# Patient Record
Sex: Male | Born: 1970 | Race: White | Hispanic: No | Marital: Single | Smoking: Current every day smoker
Health system: Southern US, Community
[De-identification: ages and names within clinical notes are randomized; demographics above are authoritative.]

---

## 2009-12-19 ENCOUNTER — Emergency Department (HOSPITAL_COMMUNITY)
Admission: EM | Admit: 2009-12-19 | Discharge: 2009-12-19 | Payer: Self-pay | Source: Home / Self Care | Admitting: Emergency Medicine

## 2015-03-17 ENCOUNTER — Telehealth: Payer: Self-pay

## 2015-03-17 ENCOUNTER — Ambulatory Visit (INDEPENDENT_AMBULATORY_CARE_PROVIDER_SITE_OTHER): Payer: Self-pay | Admitting: Family Medicine

## 2015-03-17 VITALS — BP 112/78 | HR 72 | Temp 98.2°F | Resp 16 | Ht 70.0 in | Wt 212.0 lb

## 2015-03-17 DIAGNOSIS — G44219 Episodic tension-type headache, not intractable: Secondary | ICD-10-CM

## 2015-03-17 DIAGNOSIS — J069 Acute upper respiratory infection, unspecified: Secondary | ICD-10-CM

## 2015-03-17 MED ORDER — PREDNISONE 20 MG PO TABS: ORAL_TABLET | ORAL | Status: DC

## 2015-03-17 MED ORDER — PREDNISONE 20 MG PO TABS
ORAL_TABLET | ORAL | Status: DC
Start: 2015-03-17 — End: 2015-04-23

## 2015-03-17 MED ORDER — CARISOPRODOL 350 MG PO TABS: 350.0000 mg | ORAL_TABLET | Freq: Two times a day (BID) | ORAL | Status: DC | PRN

## 2015-03-17 NOTE — Telephone Encounter (Signed)
Had to call [facility] [address] to cancel Prednisone Rx because it was sent to the wrong pharmacy. Sent new Rx to new pharmacy.

## 2015-03-17 NOTE — Progress Notes (Addendum)
 @[facility]FCLOGO @  This chart was scribed for Elvina SidleKurt Lauenstein, MD by Andrew Auaven Small, ED Scribe. This patient was seen in room 10 and the patient's care was started at 9:47 AM.  Patient ID: Dylan Ellis MRN: [id number], DOB: 03/17/1971, 44 y.o. Date of Encounter: 03/17/2015, 9:50 AM  Primary Physician: No primary care provider on file.  Chief Complaint:  Chief Complaint  Patient presents with   Headache   Nasal Congestion    x 1 week    Fatigue     HPI: [age] y.o. year old male with history below presents with a right sided tension HA for about 1.5 weeks. Pt states for the past week and a half he has been waking up with a right sided tension HA.  He reports associated congestion, rhinorrhea, chills, fatigue and feeling run down. His sister gave her a left over Zpak without relief to symptoms, he has had similar pain in the past and was prescribed a muscle relaxer and prednisone which she states worked well for her. 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 Pt states he really enjoyed her original job and has been recently stressed about work, possibly causing symptoms.   History reviewed. No pertinent past medical history.   Home Meds: Prior to Admission medications   Not on File    Allergies: No Known Allergies  Social History   Social History   Marital Status: Single    Spouse Name: N/A   Number of Children: N/A   Years of Education: N/A   Occupational History   Not on file.   Social History Main Topics   Smoking status: Current Every Day Smoker   Smokeless tobacco: Not on file   Alcohol Use: No   Drug Use: No   Sexual Activity: Not on file   Other Topics Concern   Not on file   Social History Narrative   No narrative on file     Review of Systems: Constitutional: negative for chills, fever, night sweats, weight  changes. HEENT: negative for vision changes, hearing loss,  ST, epistaxis, or sinus pressure Cardiovascular: negative for chest pain or palpitations Respiratory: negative for hemoptysis, wheezing, shortness of breath, or cough Abdominal: negative for abdominal pain, nausea, vomiting, diarrhea, or constipation Dermatological: negative for rash Neurologic: negative for dizziness, or syncope All other systems reviewed and are otherwise negative with the exception to those above and in the HPI.   Physical Exam: Blood pressure 112/78, pulse 72, temperature 98.2 F (36.8 C), temperature source Oral, resp. rate 16, height 5\' 10"  (1.778 m), weight 212 lb (96.163 kg), SpO2 98 %., Body mass index is 30.42 kg/(m^2). General: Well developed, well nourished, in no acute distress. Head: Normocephalic, atraumatic, eyes without discharge, sclera non-icteric, nares are without discharge. Bilateral auditory canals clear, TM's are without perforation, pearly grey and translucent with reflective cone of light bilaterally. Oral cavity moist, posterior pharynx without exudate, erythema, peritonsillar abscess, or post nasal drip.  Neck: Supple. No thyromegaly. Full ROM. No lymphadenopathy. Lungs: Clear bilaterally to auscultation without wheezes, rales, or rhonchi. Breathing is unlabored. Heart: RRR with S1 S2. No murmurs, rubs, or gallops appreciated. Abdomen: Soft, non-tender, non-distended with normoactive bowel sounds. No hepatomegaly. No rebound/guarding. No obvious abdominal masses. Msk:  Strength and tone normal for age. Extremities/Skin: Warm and dry. No clubbing or cyanosis. No  edema. No rashes or suspicious lesions. Neuro: Alert and oriented X 3. Moves all extremities spontaneously. Gait is normal. CNII-XII grossly in tact. Psych:  Responds to questions appropriately with a normal affect.     ASSESSMENT AND PLAN:  44 y.o. year old male with  This chart was scribed in my presence and reviewed by me  personally.    ICD-9-CM ICD-10-CM   1. Episodic tension-type headache, not intractable 339.11 G44.219 carisoprodol (SOMA) 350 MG tablet  2. Acute upper respiratory infection 465.9 J06.9 predniSONE (DELTASONE) 20 MG tablet     DISCONTINUED: predniSONE (DELTASONE) 20 MG tablet     By signing my name below, I, Raven Small, attest that this documentation has been prepared under the direction and in the presence of Elvina Sidle, MD.  Electronically Signed: Andrew Au, ED Scribe. 03/17/2015. 9:58 AM.  Signed, Elvina Sidle, MD 03/17/2015 9:50 AM

## 2015-04-23 ENCOUNTER — Ambulatory Visit (INDEPENDENT_AMBULATORY_CARE_PROVIDER_SITE_OTHER): Payer: Self-pay | Admitting: Emergency Medicine

## 2015-04-23 VITALS — BP 118/72 | HR 113 | Temp 98.7°F | Resp 18 | Ht 70.0 in | Wt 213.0 lb

## 2015-04-23 DIAGNOSIS — J069 Acute upper respiratory infection, unspecified: Secondary | ICD-10-CM

## 2015-04-23 DIAGNOSIS — J0100 Acute maxillary sinusitis, unspecified: Secondary | ICD-10-CM

## 2015-04-23 LAB — GLUCOSE, POCT (MANUAL RESULT ENTRY): POC Glucose: 102 mg/dl — AB (ref 70–99)

## 2015-04-23 MED ORDER — AMOXICILLIN-POT CLAVULANATE 875-125 MG PO TABS: 1.0000 | ORAL_TABLET | Freq: Two times a day (BID) | ORAL | Status: DC

## 2015-04-23 MED ORDER — PREDNISONE 20 MG PO TABS: ORAL_TABLET | ORAL | Status: DC

## 2015-04-23 MED ORDER — FLUTICASONE PROPIONATE 50 MCG/ACT NA SUSP: 2.0000 | Freq: Every day | NASAL | Status: DC

## 2015-04-23 NOTE — Progress Notes (Signed)
This chart was scribed for Dylan ChrisSteven Karlissa Aron, MD by Andrew Auaven Small, ED Scribe. This patient was seen in room 3 and the patient's care was started at 1:02 PM.  Chief Complaint:  Chief Complaint  Patient presents with  . Sinusitis    x3 days  . Headache    rt side of headache  . Facial Pain    rt side     HPI: Dylan Ellis is a 44 y.o. male who [facility]. Pt was seen here about a month ago for a URI and was prescribed prednisone. Pt states symptoms improved while taking medication but once he stop he began to develop sinus symptoms consisting of sinus pressure, sinus tenderness, chills, right sided HA and a mild cough. Pt took left over amoxicillin. He denies fever. No drug allergies. Pt smokes a gram of marijuana a day.   Pt also complains of neck spasms.   History reviewed. No pertinent past medical history. History reviewed. No pertinent past surgical history. Social History   Social History  . Marital Status: Single    Spouse Name: N/A  . Number of Children: N/A  . Years of Education: N/A   Social History Main Topics  . Smoking status: Current Every Day Smoker  . Smokeless tobacco: None  . Alcohol Use: No  . Drug Use: No  . Sexual Activity: Not Asked   Other Topics Concern  . None   Social History Narrative   History reviewed. No pertinent family history. No Known Allergies Prior to Admission medications   Medication Sig Start Date End Date Taking? Authorizing Provider  carisoprodol (SOMA) 350 MG tablet Take 1 tablet (350 mg total) by mouth 2 (two) times daily as needed for muscle spasms. Patient not taking: Reported on 04/23/2015 03/17/15   Elvina SidleKurt Lauenstein, MD  predniSONE (DELTASONE) 20 MG tablet Two daily with food Patient not taking: Reported on 04/23/2015 03/17/15   Elvina SidleKurt Lauenstein, MD     ROS: The patient denies fevers, chills, night sweats, unintentional weight loss, chest pain, palpitations, wheezing, dyspnea on exertion,  nausea, vomiting, abdominal pain, dysuria, hematuria, melena, numbness, weakness, or tingling.   All other systems have been reviewed and were otherwise negative with the exception of those mentioned in the HPI and as above.    PHYSICAL EXAM: Filed Vitals:   04/23/15 1256  BP: 118/72  Pulse: 113  Temp: 98.7 F (37.1 C)  Resp: 18   Body mass index is 30.56 kg/(m^2).   General: Alert, no acute distress HEENT:  Normocephalic, atraumatic, oropharynx patent. Nasal congestion, TM's clear. Eye: [facility]OMI, [facility] Cardiovascular:  Regular rate and rhythm, no rubs murmurs or gallops.  No Carotid bruits, radial pulse intact. No pedal edema.  Respiratory: Clear to auscultation bilaterally.  No wheezes, rales, or rhonchi.  No cyanosis, no use of accessory musculature Abdominal: No organomegaly, abdomen is soft and non-tender, positive bowel sounds.  No masses. Musculoskeletal: Gait intact. No edema, tenderness Skin: No rashes. Neurologic: Facial musculature symmetric. Psychiatric: Patient acts appropriately throughout our interaction. Lymphatic: No cervical or submandibular lymphadenopathy  Meds ordered this encounter  Medications  . amoxicillin-clavulanate (AUGMENTIN) 875-125 MG tablet    Sig: Take 1 tablet by mouth 2 (two) times daily.    Dispense:  14 tablet    Refill:  0  . predniSONE (DELTASONE) 20 MG tablet    Sig: Two daily with food    Dispense:  10 tablet    Refill:  0  .  fluticasone (FLONASE) 50 MCG/ACT nasal spray    Sig: Place 2 sprays into both nostrils daily.    Dispense:  16 g    Refill:  6    Results for orders placed or performed in visit on 04/23/15  POCT glucose (manual entry)  Result Value Ref Range   POC Glucose 102 (A) 70 - 99 mg/dl   ASSESSMENT/PLAN:  Patient placed on 5 days of prednisone along with Augmentin. He was also given flonase. I personally performed the services described in this documentation, which was scribed in my presence. The recorded  information has been reviewed and is accurate.  Gross sideeffects, risk and benefits, and alternatives of medications d/w patient. Patient is aware that all medications have potential sideeffects and we are unable to predict every sideeffect or drug-drug interaction that may occur.  By signing my name below, I, Raven Small, attest that this documentation has been prepared under the direction and in the presence of Dylan Chris, MD.  Electronically Signed: Andrew Au, ED Scribe. 04/23/2015. 1:10 PM.  Dylan Chris MD 04/23/2015 1:02 PM

## 2015-04-23 NOTE — Patient Instructions (Signed)

## 2015-06-13 ENCOUNTER — Ambulatory Visit (INDEPENDENT_AMBULATORY_CARE_PROVIDER_SITE_OTHER): Payer: BLUE CROSS/BLUE SHIELD | Admitting: Emergency Medicine

## 2015-06-13 ENCOUNTER — Ambulatory Visit (HOSPITAL_BASED_OUTPATIENT_CLINIC_OR_DEPARTMENT_OTHER)
Admission: RE | Admit: 2015-06-13 | Discharge: 2015-06-13 | Disposition: A | Discharge status: Home / Self Care | Payer: BLUE CROSS/BLUE SHIELD | Source: Ambulatory Visit | Attending: Emergency Medicine | Admitting: Emergency Medicine

## 2015-06-13 ENCOUNTER — Ambulatory Visit (INDEPENDENT_AMBULATORY_CARE_PROVIDER_SITE_OTHER): Payer: BLUE CROSS/BLUE SHIELD

## 2015-06-13 VITALS — BP 128/82 | HR 112 | Temp 98.4°F | Resp 16 | Ht 70.0 in | Wt 220.0 lb

## 2015-06-13 DIAGNOSIS — R51 Headache: Secondary | ICD-10-CM | POA: Diagnosis present

## 2015-06-13 DIAGNOSIS — J0100 Acute maxillary sinusitis, unspecified: Secondary | ICD-10-CM | POA: Diagnosis not present

## 2015-06-13 DIAGNOSIS — G44219 Episodic tension-type headache, not intractable: Secondary | ICD-10-CM | POA: Insufficient documentation

## 2015-06-13 DIAGNOSIS — J069 Acute upper respiratory infection, unspecified: Secondary | ICD-10-CM

## 2015-06-13 MED ORDER — PREDNISONE 20 MG PO TABS
ORAL_TABLET | ORAL | Status: DC
Start: 2015-06-13 — End: 2015-06-21

## 2015-06-13 NOTE — Progress Notes (Addendum)
Patient ID: Dylan Ellis, male   DOB: 07/15/1970, 45 y.o.   MRN: [id number]     By signing my name below, I, Essence Howell, attest that this documentation has been prepared under the direction and in the presence of Collene GobbleSteven A Daub, MD Electronically Signed: Charline BillsEssence Howell, ED Scribe 06/13/2015 at 11:58 AM.  Chief Complaint:  Chief Complaint  Patient presents with  . Sinusitis    went away then came back  . Headache   HPI: Dylan Ellis is a 45 y.o. male who [facility] follow-[facility] regarding HAs. Pt was seen on 03/17/15 for the same and was diagnosed with tensions HAs. Pt was also seen on 04/23/15 for sinusitis which resolved after Prednisone and antibiotics. He reports persistent neck pain that he describes as tension. He has tried Advil with significant temporary relief. Pt denies vomiting and visual disturbances.   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    History reviewed. No pertinent past medical history. History reviewed. No pertinent past surgical history. Social History   Social History  . Marital Status: Single    Spouse Name: N/A  . Number of Children: N/A  . Years of Education: N/A   Social History Main Topics  . Smoking status: Current Every Day Smoker  . Smokeless tobacco: None  . Alcohol Use: No  . Drug Use: No  . Sexual Activity: Not Asked   Other Topics Concern  . None   Social History Narrative   History reviewed. No pertinent family history. No Known Allergies Prior to Admission medications   Medication Sig Start Date End Date Taking? Authorizing Provider  amoxicillin-clavulanate (AUGMENTIN) 875-125 MG tablet Take 1 tablet by mouth 2 (two) times daily. Patient not taking: Reported on 06/13/2015 04/23/15   Collene GobbleSteven A Daub, MD  carisoprodol (SOMA) 350 MG tablet Take 1 tablet (350 mg total) by mouth 2 (two) times daily as needed for muscle spasms. Patient not taking: Reported on  04/23/2015 03/17/15   Elvina SidleKurt Lauenstein, MD  fluticasone [facility](FLONASE) 50 MCG/ACT nasal spray Place 2 sprays into both nostrils daily. Patient not taking: Reported on 06/13/2015 04/23/15   Collene GobbleSteven A Daub, MD  predniSONE (DELTASONE) 20 MG tablet Two daily with food Patient not taking: Reported on 06/13/2015 04/23/15   Collene GobbleSteven A Daub, MD     ROS: The patient denies fevers, chills, night sweats, unintentional weight loss, visual disturbances, chest pain, palpitations, wheezing, dyspnea on exertion, nausea, vomiting, abdominal pain, dysuria, hematuria, melena, numbness, weakness, or tingling. +neck pain, +HA  All other systems have been reviewed and were otherwise negative with the exception of those mentioned in the HPI and as above.    PHYSICAL EXAM: Filed Vitals:   06/13/15 1132  BP: 128/82  Pulse: 112  Temp: 98.4 F (36.9 C)  Resp: 16   Body mass index is 31.57 kg/(m^2).  General: Alert, no acute distress HEENT:  Normocephalic, atraumatic, oropharynx patent. Eye: Nonie HoyerOMI, [facility] Cardiovascular:  Regular rate and rhythm, no rubs murmurs or gallops. No Carotid bruits, radial pulse intact. No pedal edema.  Respiratory: Clear to auscultation bilaterally. No wheezes, rales, or rhonchi. No cyanosis, no use of accessory musculature Abdominal: No organomegaly, abdomen is soft and non-tender, positive bowel sounds. No masses. Musculoskeletal: Gait intact. No edema, tenderness Skin: No rashes. Neurologic: Facial musculature symmetric. Psychiatric: Patient acts appropriately throughout our interaction. Lymphatic: No cervical or submandibular lymphadenopathy  LABS:  EKG/XRAY:  Primary read interpreted by Dr. Cleta Alberts at [facility] No evidence of sinusitis.   ASSESSMENT/PLAN: Patient went to the hospital had a CT which was negative. I do believe that stress is playing a factor. He did improve with a taper of prednisone raising the question of a cluster type headache. I advised the patient to return to  clinic next week and we could discuss factors relating to his depression.I personally performed the services described in this documentation, which was scribed in my presence. The recorded information has been reviewed and is accurate.    Gross sideeffects, risk and benefits, and alternatives of medications d/w patient. Patient is aware that all medications have potential sideeffects and we are unable to predict every sideeffect or drug-drug interaction that may occur.  Lesle Chris MD 06/13/2015 11:50 AM

## 2015-06-13 NOTE — Patient Instructions (Signed)
[facility] and register at Imaging for outpatient CT Head [address], [place], [place] [address]  Phone: [phone]

## 2015-06-20 ENCOUNTER — Telehealth: Payer: Self-pay

## 2015-06-20 NOTE — Telephone Encounter (Signed)
Pt is needing a refill on prednizone  From dr Cleta Alberts pt is hoping to come in on Saturday to see dr Cleta Alberts   Best number [phone]

## 2015-06-21 ENCOUNTER — Other Ambulatory Visit: Payer: Self-pay | Admitting: Emergency Medicine

## 2015-06-21 DIAGNOSIS — J069 Acute upper respiratory infection, unspecified: Secondary | ICD-10-CM

## 2015-06-21 DIAGNOSIS — J0100 Acute maxillary sinusitis, unspecified: Secondary | ICD-10-CM

## 2015-06-21 DIAGNOSIS — G44229 Chronic tension-type headache, not intractable: Secondary | ICD-10-CM

## 2015-06-21 MED ORDER — PREDNISONE 20 MG PO TABS: ORAL_TABLET | ORAL | Status: DC

## 2015-06-21 NOTE — Telephone Encounter (Signed)
Patient is calling on status of his request - it's been almost 24 hours.   [phone]

## 2015-06-21 NOTE — Telephone Encounter (Signed)
I sent in a refill on his prednisone but I do need to see him this weekend. I also placed a referral to neurology to get help with these headaches.

## 2015-06-22 NOTE — Telephone Encounter (Signed)
SPoke with pt, advised message from Dr Daub. Pt understood. 

## 2016-04-21 ENCOUNTER — Ambulatory Visit (INDEPENDENT_AMBULATORY_CARE_PROVIDER_SITE_OTHER): Payer: Self-pay | Admitting: Family Medicine

## 2016-04-21 DIAGNOSIS — J069 Acute upper respiratory infection, unspecified: Secondary | ICD-10-CM

## 2016-04-21 NOTE — Assessment & Plan Note (Signed)
Symptoms suggestive of a viral infection. Has been occurring for a 3-4 days. Possible for flu.  - provided reassurance.  - supportive care  - He will call back if symptoms worsen or remain.

## 2016-04-21 NOTE — Progress Notes (Signed)
   Subjective:    Patient ID: Dylan Ellis, male    DOB: 01/25/1971, 45 y.o.   MRN: [id number]  Chief Complaint  Patient presents with  . Cough  . URI  . Fatigue    PCP: No PCP Per Patient  HPI  This is a [age] y.o. male who is presenting with flu like symptoms. He has been hot and cold for the past 3 or 4 days. Has been missing work. No emesis but felt warm. Works at [employer] and has likely been around someone that has bene sick. No recent vaccinations. Has not received the flu vaccine this year. Some sneezing but no itchy watery eyes.  Feels like the symptoms are staying the same. Has had some diarrhea. Denies any travel. No GI discomfort. No muscles aches. .   Review of Systems  ROS: No unexpected weight loss, fever, chills, swelling, instability, muscle pain, numbness/tingling, redness, otherwise see HPI   PMH: none  PShx: tonsillectomy  PSx: no tobacco. Occasional alcohol use.  FHx: none     Objective:   Physical Exam BP 124/80 (BP Location: Right Arm, Patient Position: Sitting, Cuff Size: Normal)   Pulse (!) 108   Temp 98.5 F (36.9 C) (Oral)   Resp 17   Ht 5' 10.5" (1.791 m)   Wt 239 lb (108.4 kg)   SpO2 98%   BMI 33.81 kg/m  Gen: NAD, alert, cooperative with exam, well-appearing HEENT:  EOMI, clear conjunctiva, oropharynx clear, supple neck, TM's clear and intact, Tacky MM, no cervical LAD, no tonsillar exudates, turbinates normal  CV: tachycardic, good S1/S2, no murmur, no edema, capillary refill brisk  Resp: CTABL, no wheezes, non-labored Abd: SNTND, BS present, no guarding or organomegaly Skin: no rashes, normal turgor  Neuro: no gross deficits.  Psych: alert and oriented      Assessment & Plan:   URI (upper respiratory infection) Symptoms suggestive of a viral infection. Has been occurring for a 3-4 days. Possible for flu.  - provided reassurance.  - supportive care  - He will call back if symptoms worsen or remain.

## 2016-04-21 NOTE — Patient Instructions (Addendum)
Thank you for coming in,   This is most likely a viral illness and it will take time for it to improve.   Please call back if you are not feeling better or your symptoms worsen.     Please feel free to call with any questions or concerns at any time, at [phone]. --Dr. Jordan LikesSchmitz     IF you received an x-ray today, you will receive an invoice from [facility]. Please contact [facility] at [phone] with questions or concerns regarding your invoice.   IF you received labwork today, you will receive an invoice from [facility]. Please contact [facility] at [phone] with questions or concerns regarding your invoice.   Our billing staff will not be able to assist you with questions regarding bills from these companies.  You will be contacted with the lab results as soon as they are available. The fastest way to get your results is to activate your My Chart account. Instructions are located on the last page of this paperwork. If you have not heard from us regarding the results in 2 weeks, please contact this office.

## 2016-09-06 IMAGING — CT CT HEAD W/O CM
1 series · 16 of 30 positions shown, 20 images · non-contrast
Comparison: None.

CLINICAL DATA: Right-sided headaches, no known injury, initial
encounter

EXAM:
CT HEAD WITHOUT CONTRAST
TECHNIQUE: Contiguous axial images were obtained from the base of the skull
through the vertex without intravenous contrast.

[Series 2: head wo · axial · 0.49mm/px · z∈[+1393,+1538]mm · 16 of 33 slices shown, 20 images]
[im 2/33  brain]
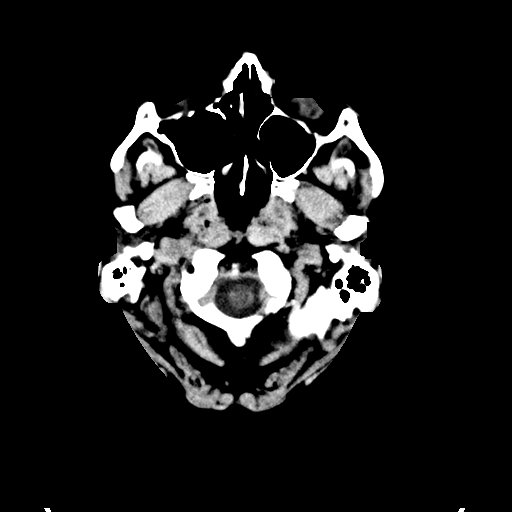
[im 2/33  bone]
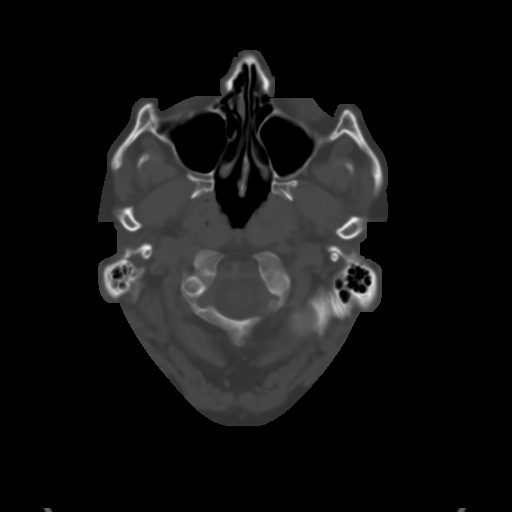
[im 4/33  brain]
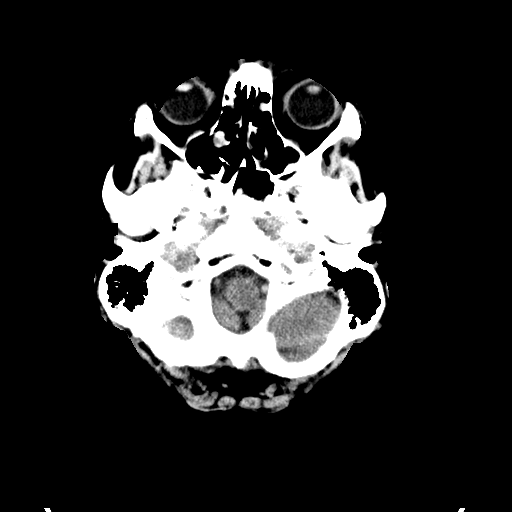
[im 6/33  brain]
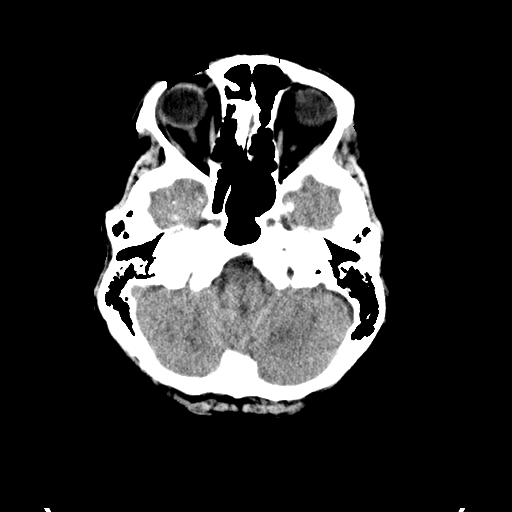
[im 8/33  brain]
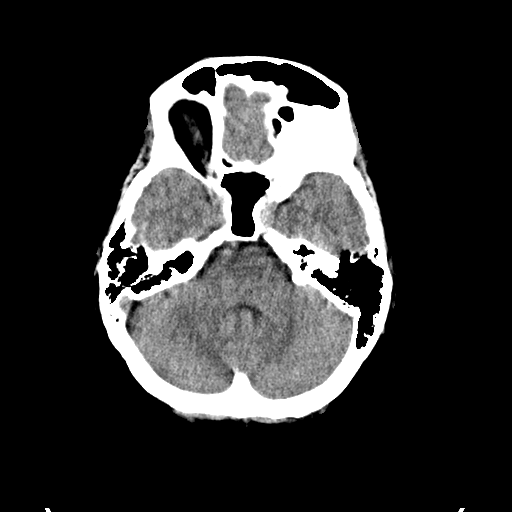
[im 9/33  brain]
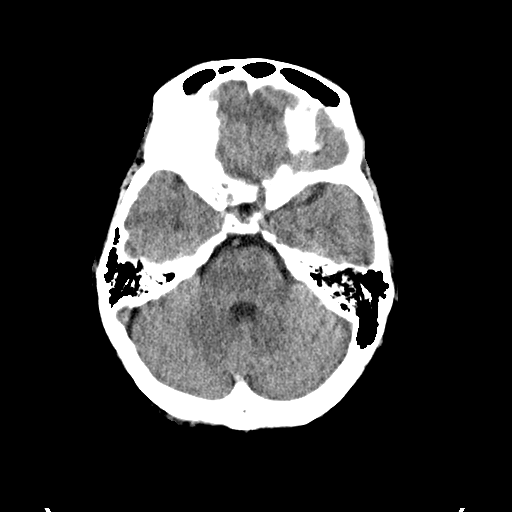
[im 9/33  bone]
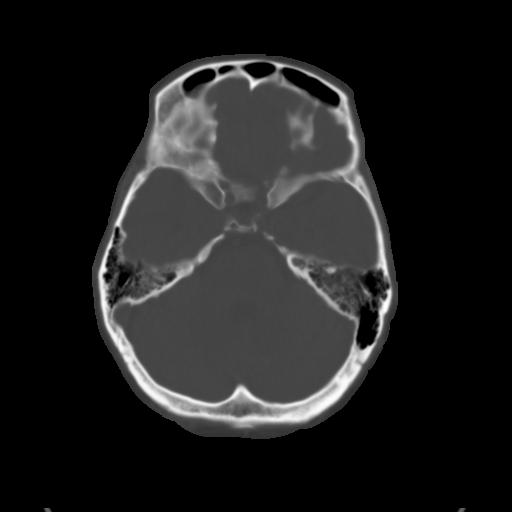
[im 12/33  brain]
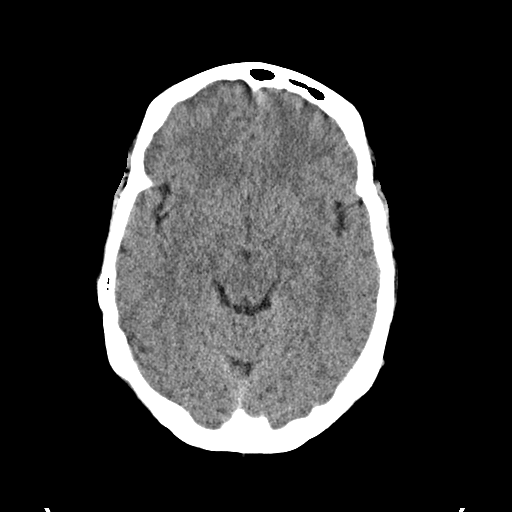
[im 14/33  brain]
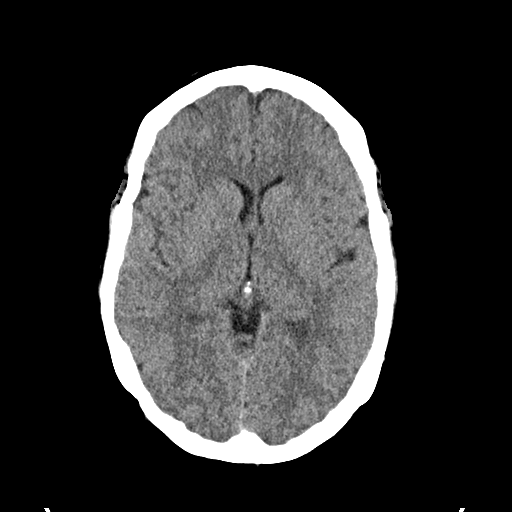
[im 16/33  brain]
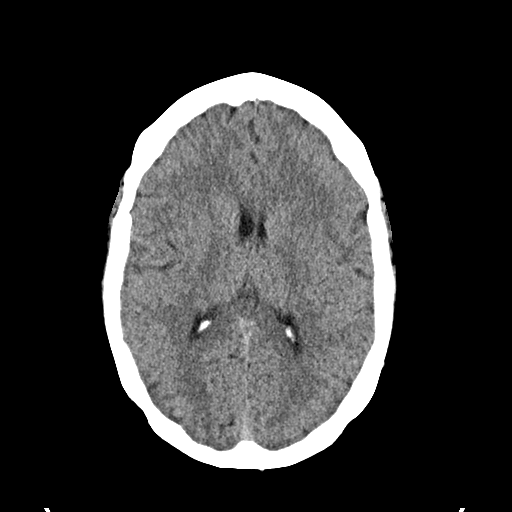
[im 17/33  brain]
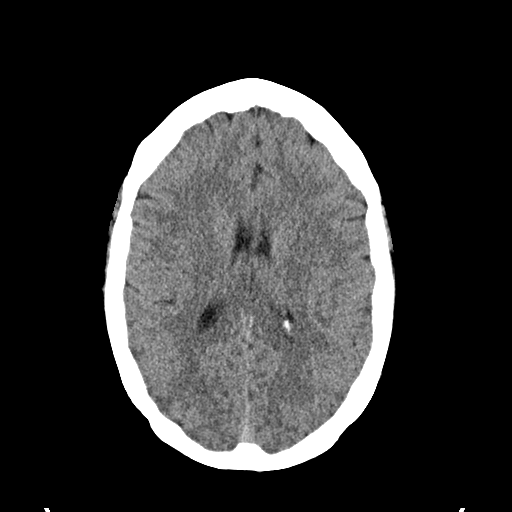
[im 17/33  bone]
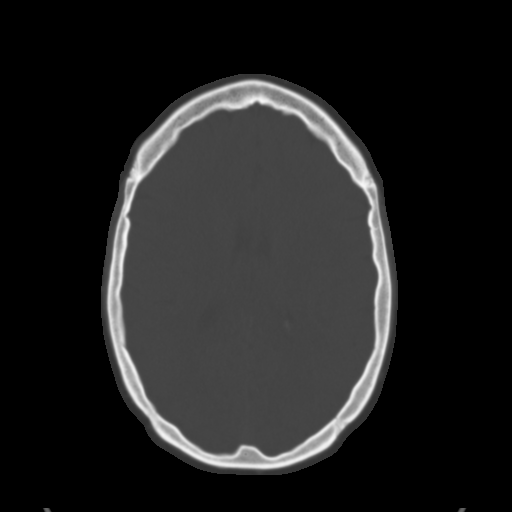
[im 19/33  brain]
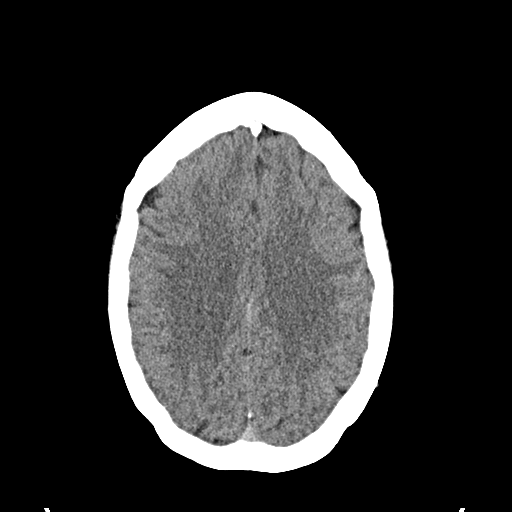
[im 21/33  brain]
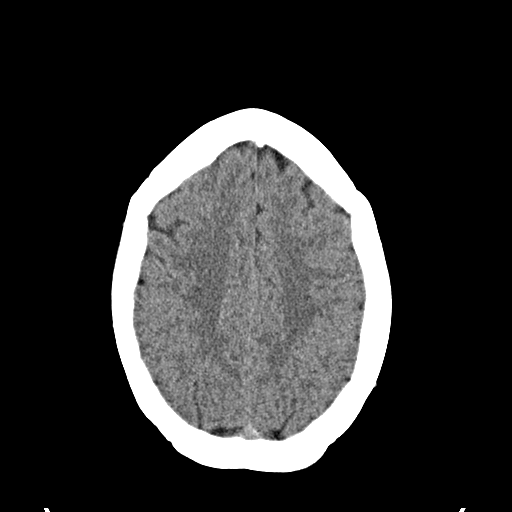
[im 24/33  brain]
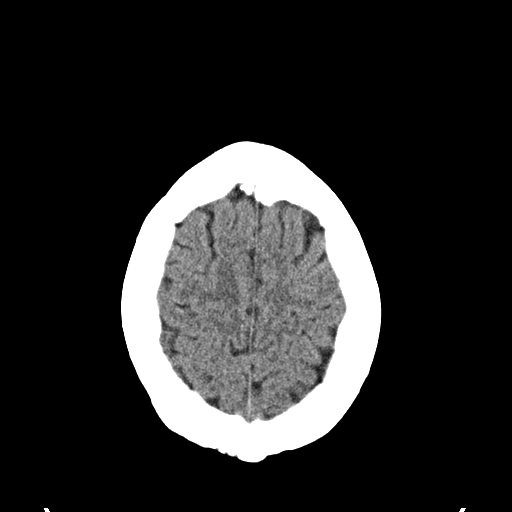
[im 25/33  brain]
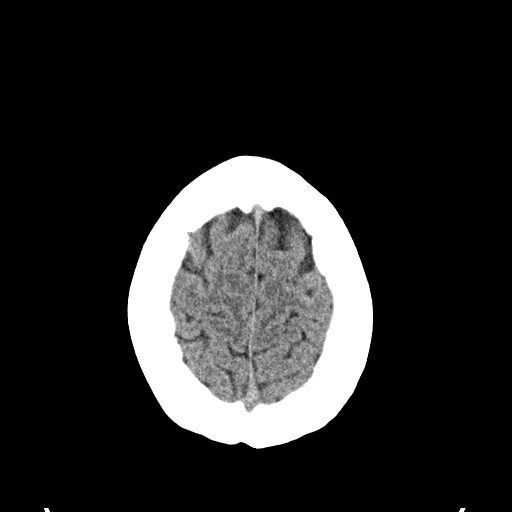
[im 25/33  bone]
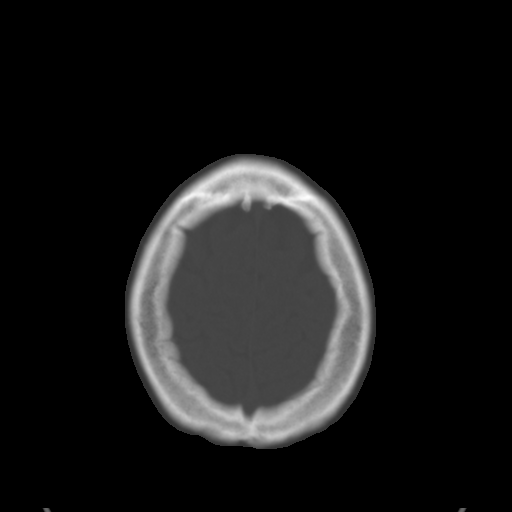
[im 27/33  brain]
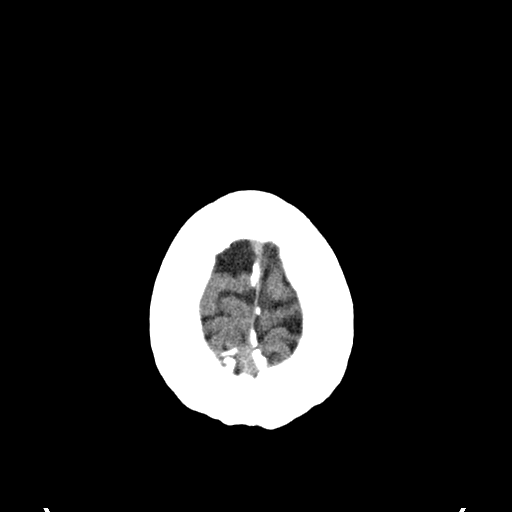
[im 29/33  brain]
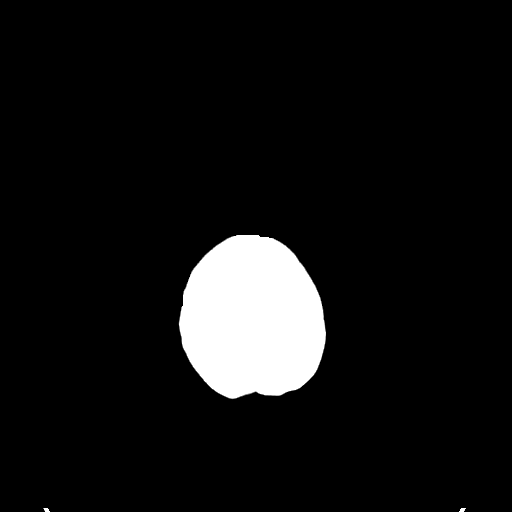
[im 31/33  brain]
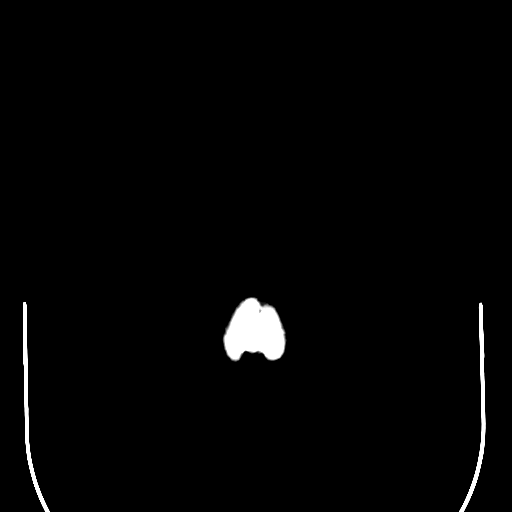

[16 of 30 positions shown; findings below may reference images not displayed]

FINDINGS: The bony calvarium is intact. No acute hemorrhage, acute infarction
or space-occupying mass lesion is identified.
IMPRESSION: No acute abnormality noted

## 2016-09-06 IMAGING — CR DG SINUSES 1-2V
1 series · 1 of 1 positions shown · non-contrast
Comparison: No prior.

CLINICAL DATA: Sinus pressure.

EXAM:
PARANASAL SINUSES - 1-2 VIEW

[waters]
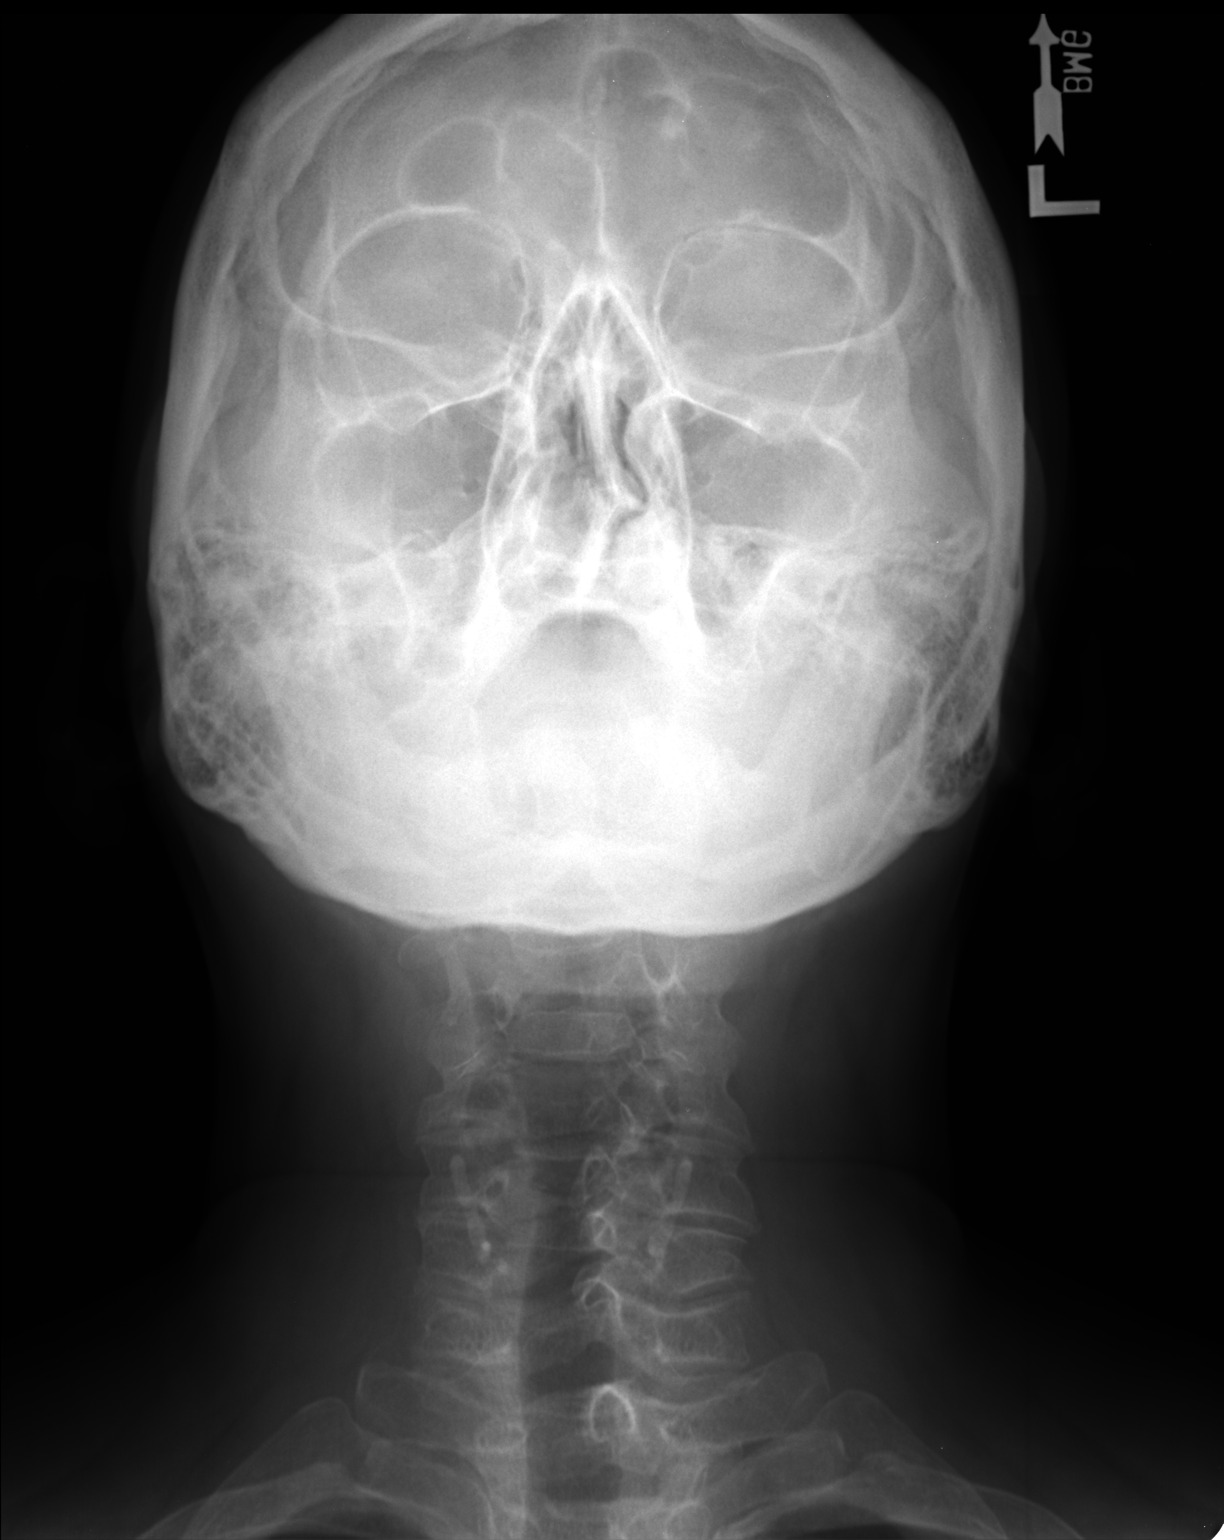

[1 of 1 positions shown; findings below may reference images not displayed]

FINDINGS: Paranasal sinuses are clear on single Waters view. Mastoids are
clear. No acute bony abnormality.
IMPRESSION: Negative exam.
